# Patient Record
Sex: Female | Born: 1980 | Race: White | Hispanic: No | Marital: Married | Smoking: Never smoker
Health system: Southern US, Community
[De-identification: ages and names within clinical notes are randomized; demographics above are authoritative.]

## PROBLEM LIST (undated history)

## (undated) DIAGNOSIS — R519 Headache, unspecified: Secondary | ICD-10-CM

## (undated) DIAGNOSIS — G43909 Migraine, unspecified, not intractable, without status migrainosus: Secondary | ICD-10-CM

## (undated) DIAGNOSIS — H8109 Meniere's disease, unspecified ear: Secondary | ICD-10-CM

## (undated) DIAGNOSIS — E7800 Pure hypercholesterolemia, unspecified: Secondary | ICD-10-CM

## (undated) DIAGNOSIS — N6019 Diffuse cystic mastopathy of unspecified breast: Secondary | ICD-10-CM

## (undated) DIAGNOSIS — T7840XA Allergy, unspecified, initial encounter: Secondary | ICD-10-CM

## (undated) DIAGNOSIS — E039 Hypothyroidism, unspecified: Secondary | ICD-10-CM

## (undated) HISTORY — PX: MELANOMA EXCISION: SHX5266

## (undated) HISTORY — PX: TONSILLECTOMY: SUR1361

## (undated) HISTORY — DX: Hypothyroidism, unspecified: E03.9

## (undated) HISTORY — DX: Migraine, unspecified, not intractable, without status migrainosus: G43.909

## (undated) HISTORY — DX: Diffuse cystic mastopathy of unspecified breast: N60.19

## (undated) HISTORY — DX: Allergy, unspecified, initial encounter: T78.40XA

## (undated) HISTORY — DX: Meniere's disease, unspecified ear: H81.09

## (undated) HISTORY — DX: Pure hypercholesterolemia, unspecified: E78.00

## (undated) HISTORY — PX: WISDOM TOOTH EXTRACTION: SHX21

---

## 2019-06-18 LAB — OB RESULTS CONSOLE HGB/HCT, BLOOD
HCT: 41 (ref 29–41)
Hemoglobin: 13.9

## 2019-06-18 LAB — OB RESULTS CONSOLE PLATELET COUNT: Platelets: 332

## 2019-06-26 ENCOUNTER — Telehealth (INDEPENDENT_AMBULATORY_CARE_PROVIDER_SITE_OTHER): Payer: Self-pay | Admitting: Family Medicine

## 2019-06-26 DIAGNOSIS — O26891 Other specified pregnancy related conditions, first trimester: Secondary | ICD-10-CM

## 2019-06-26 DIAGNOSIS — R12 Heartburn: Secondary | ICD-10-CM

## 2019-06-26 NOTE — Telephone Encounter (Signed)
Pt called and reports she has heartburn and was taking Omeprazole. She is now taking Tums. She is getting it with all foods. She is eating small frequent meals. She is noticing that the heartburn is worse after PNV, Enc her to try in the evening.   Pt has appt in 1 month. Discussed we will not be able to prescribe for her until she is seen in the office. Pt voiced understanding.

## 2019-06-26 NOTE — Telephone Encounter (Signed)
Patient has not started care with [place] yet, but has a few questions and would like a call back.

## 2019-07-04 ENCOUNTER — Telehealth: Payer: Self-pay

## 2019-07-04 NOTE — Telephone Encounter (Signed)
Pt left VM stating she has some questions and would like a call back. Pt has new OB appt on 07/30/19 at which time she will be 11 weeks. Pt states this is a high risk pregnancy.

## 2019-07-05 NOTE — Telephone Encounter (Signed)
Called patient and she reports concern over new OB appt being on 2/23 at which time she will be 11 weeks. Patient states she knows she is high risk because she is 39 years old and is overweight. Patient states she had blood work done recently and her cholesterol and liver enzymes were elevated. She reports concern over new ob appt being at 11 weeks and felt she may need to be seen sooner. Reassured patient that most of our patients are seen around 11 weeks even our patients that are high risk. Discussed rarely are there health conditions in which we need to see someone sooner. Advised she mention her health concerns at her new OB appt. Patient verbalized understanding & had no questions.

## 2019-07-16 DIAGNOSIS — O0990 Supervision of high risk pregnancy, unspecified, unspecified trimester: Secondary | ICD-10-CM

## 2019-07-19 ENCOUNTER — Other Ambulatory Visit: Payer: Self-pay

## 2019-07-19 ENCOUNTER — Telehealth: Payer: Self-pay | Admitting: Family Medicine

## 2019-07-19 ENCOUNTER — Encounter (HOSPITAL_COMMUNITY): Payer: Self-pay | Admitting: Obstetrics & Gynecology

## 2019-07-19 ENCOUNTER — Inpatient Hospital Stay (HOSPITAL_COMMUNITY)
Admission: AD | Admit: 2019-07-19 | Discharge: 2019-07-19 | Disposition: A | Discharge status: Home / Self Care | Payer: No Typology Code available for payment source | Attending: Obstetrics & Gynecology | Admitting: Obstetrics & Gynecology

## 2019-07-19 ENCOUNTER — Inpatient Hospital Stay (HOSPITAL_COMMUNITY): Payer: No Typology Code available for payment source

## 2019-07-19 DIAGNOSIS — Z809 Family history of malignant neoplasm, unspecified: Secondary | ICD-10-CM | POA: Insufficient documentation

## 2019-07-19 DIAGNOSIS — O0991 Supervision of high risk pregnancy, unspecified, first trimester: Secondary | ICD-10-CM

## 2019-07-19 DIAGNOSIS — O3680X0 Pregnancy with inconclusive fetal viability, not applicable or unspecified: Secondary | ICD-10-CM | POA: Diagnosis present

## 2019-07-19 DIAGNOSIS — Z85820 Personal history of malignant melanoma of skin: Secondary | ICD-10-CM | POA: Insufficient documentation

## 2019-07-19 DIAGNOSIS — O99891 Other specified diseases and conditions complicating pregnancy: Secondary | ICD-10-CM | POA: Insufficient documentation

## 2019-07-19 DIAGNOSIS — O26851 Spotting complicating pregnancy, first trimester: Secondary | ICD-10-CM | POA: Diagnosis not present

## 2019-07-19 DIAGNOSIS — Z3A09 9 weeks gestation of pregnancy: Secondary | ICD-10-CM | POA: Diagnosis not present

## 2019-07-19 DIAGNOSIS — R1030 Lower abdominal pain, unspecified: Secondary | ICD-10-CM | POA: Diagnosis not present

## 2019-07-19 DIAGNOSIS — O0990 Supervision of high risk pregnancy, unspecified, unspecified trimester: Secondary | ICD-10-CM

## 2019-07-19 DIAGNOSIS — Z79899 Other long term (current) drug therapy: Secondary | ICD-10-CM | POA: Insufficient documentation

## 2019-07-19 DIAGNOSIS — Z880 Allergy status to penicillin: Secondary | ICD-10-CM | POA: Insufficient documentation

## 2019-07-19 HISTORY — DX: Headache, unspecified: R51.9

## 2019-07-19 HISTORY — DX: Meniere's disease, unspecified ear: H81.09

## 2019-07-19 LAB — HCG, QUANTITATIVE, PREGNANCY: hCG, Beta Chain, Quant, S: 16784 m[IU]/mL — ABNORMAL HIGH (ref <5)

## 2019-07-19 LAB — CBC
HCT: 41.6 % (ref 36.0–46.0)
Hemoglobin: 13.7 g/dL (ref 12.0–15.0)
MCH: 31.9 pg (ref 26.0–34.0)
MCHC: 32.9 g/dL (ref 30.0–36.0)
MCV: 97 fL (ref 80.0–100.0)
Platelets: 325 10*3/uL (ref 150–400)
RBC: 4.29 MIL/uL (ref 3.87–5.11)
RDW: 12 % (ref 11.5–15.5)
WBC: 6.9 10*3/uL (ref 4.0–10.5)
nRBC: 0 % (ref 0.0–0.2)

## 2019-07-19 LAB — WET PREP, GENITAL
Clue Cells Wet Prep HPF POC: NONE SEEN
Sperm: NONE SEEN
Trich, Wet Prep: NONE SEEN
Yeast Wet Prep HPF POC: NONE SEEN

## 2019-07-19 LAB — ABO/RH: ABO/RH(D): B POS

## 2019-07-19 LAB — HIV ANTIBODY (ROUTINE TESTING W REFLEX): HIV Screen 4th Generation wRfx: NONREACTIVE

## 2019-07-19 NOTE — Discharge Instructions (Signed)
Return to MAU:  If you have heavy bleeding that soaks through more that 2 pads per hour for an hour or more  If you bleed so much that you feel like you might pass out or you do pass out  If you have significant abdominal pain that is not improved with Tylenol 1000 mg  If you develop a fever > 100.5   

## 2019-07-19 NOTE — MAU Provider Note (Signed)
History     CSN: [id number]  Arrival date and time: 07/19/19 1159   First Provider Initiated Contact with Patient 07/19/19 1328      Chief Complaint  Patient presents with  . Abdominal Cramping  . Vaginal Bleeding   HPI  Ms.  Linda Kane is a 39 y.o. year old G82P0 female at [redacted]w[redacted]d weeks gestation who presents to MAU reporting vaginal spotting and lower abdominal cramping x 2 days. She had her pregnancy confirmed at her PCP in [place] and is scheduled to start [facility] with [facility] on 07/30/2019. She had an U/S on 07/13/2019 and the pregnancy measured 5-6 wks when she was supposed to be 9 wks. She rates her pain a 2/10 at this time. She denies any recent SI.   Past Medical History:  Diagnosis Date  . Headache   . Meniere's disease (cochlear hydrops)     Past Surgical History:  Procedure Laterality Date  . MELANOMA EXCISION    . TONSILLECTOMY    . WISDOM TOOTH EXTRACTION      Family History  Problem Relation Age of Onset  . Cancer Father   . Hypertension Father     Social History   Tobacco Use  . Smoking status: Never Smoker  . Smokeless tobacco: Never Used  Substance Use Topics  . Alcohol use: Not Currently  . Drug use: Never    Allergies:  Allergies  Allergen Reactions  . Penicillins     Medications Prior to Admission  Medication Sig Dispense Refill Last Dose  . diphenhydrAMINE (SOMINEX) 25 MG tablet Take 25 mg by mouth at bedtime as needed for itching or sleep.   07/18/2019 at Unknown time  . famotidine (PEPCID) 20 MG tablet Take 20 mg by mouth 2 (two) times daily.   07/19/2019 at Unknown time  . ibuprofen (ADVIL) 800 MG tablet Take 800 mg by mouth every 8 (eight) hours as needed.   07/18/2019 at Unknown time  . Prenatal Vit-Fe Fumarate-FA (PRENATAL MULTIVITAMIN) TABS tablet Take 1 tablet by mouth daily at 12 noon.   07/18/2019 at Unknown time  . RABEprazole (ACIPHEX) 20 MG tablet Take 40 mg by mouth daily. Take on an empty stomach, 30 minutes prior to a  meal.   07/19/2019 at Unknown time  . cetirizine (ZYRTEC) 10 MG tablet Take 10 mg by mouth daily.     . Melatonin 3 MG CAPS Take 9 mg by mouth every evening.     . Omega-3 Fatty Acids (FISH OIL PO) Take by mouth.     . propranolol (INDERAL) 80 MG tablet Take 80 mg by mouth 2 (two) times daily.     . rizatriptan (MAXALT) 10 MG tablet Take 10 mg by mouth as needed for migraine. May start with one-half tablet, take after or with Naproxen for migraine. Take one at onset, may repeat after 2 hours if no relief. Max of 30 mg per day.       Review of Systems  Constitutional: Negative.   HENT: Negative.   Eyes: Negative.   Respiratory: Negative.   Cardiovascular: Negative.   Gastrointestinal: Negative.   Endocrine: Negative.   Genitourinary: Positive for pelvic pain and vaginal bleeding (spotting).  Musculoskeletal: Negative.   Skin: Negative.   Allergic/Immunologic: Negative.   Neurological: Negative.   Hematological: Negative.   Psychiatric/Behavioral: Negative.    Physical Exam   Blood pressure (!) 138/55, pulse 94, temperature 97.8 F (36.6 C), temperature source Oral, resp. rate 17, height 5\' 7"  (1.702 m),  weight 127.1 kg, last menstrual period 05/11/2019, SpO2 100 %.  Physical Exam  Nursing note and vitals reviewed. Constitutional: She is oriented to person, place, and time. She appears well-developed and well-nourished.  HENT:  Head: Normocephalic and atraumatic.  Eyes: Pupils are equal, round, and reactive to light.  Cardiovascular: Normal rate.  Respiratory: Effort normal.  GI: Soft.  Genitourinary:    Genitourinary Comments: Uterus: non-tender, SE: cervix is smooth, pink, no lesions, small amt of thick, white vaginal d/c; no active bleeding noted or evidence of recent VB noted -- WP, GC/CT done, closed/long/firm, no CMT, (+) friability with speculum, mild adnexal tenderness bilaterally (L>R)   Musculoskeletal:        General: Normal range of motion.     Cervical back: Normal  range of motion.  Neurological: She is alert and oriented to person, place, and time.  Skin: Skin is warm and dry.  Psychiatric: She has a normal mood and affect. Her behavior is normal. Judgment and thought content normal.    MAU Course  Procedures  MDM CCUA UPT CBC ABO/Rh HCG Wet Prep GC/CT -- pending HIV -- pending OB < 14 wks [place] with TV  Results for orders placed or performed during the hospital encounter of 07/19/19 (from the past 24 hour(s))  Wet prep, genital     Status: Abnormal   Collection Time: 07/19/19  1:43 PM  Result Value Ref Range   Yeast Wet Prep HPF POC NONE SEEN NONE SEEN   Trich, Wet Prep NONE SEEN NONE SEEN   Clue Cells Wet Prep HPF POC NONE SEEN NONE SEEN   WBC, Wet Prep HPF POC MANY (A) NONE SEEN   Sperm NONE SEEN   CBC     Status: None   Collection Time: 07/19/19  1:58 PM  Result Value Ref Range   WBC 6.9 4.0 - 10.5 K/uL   RBC 4.29 3.87 - 5.11 MIL/uL   Hemoglobin 13.7 12.0 - 15.0 g/dL   HCT 13.0 86.5 - 78.4 %   MCV 97.0 80.0 - 100.0 fL   MCH 31.9 26.0 - 34.0 pg   MCHC 32.9 30.0 - 36.0 g/dL   RDW 69.6 29.5 - 28.4 %   Platelets 325 150 - 400 K/uL   nRBC 0.0 0.0 - 0.2 %  ABO/Rh     Status: None   Collection Time: 07/19/19  1:58 PM  Result Value Ref Range   ABO/RH(D) B POS    No rh immune globuloin      NOT A RH IMMUNE GLOBULIN CANDIDATE, PT RH POSITIVE Performed at [facility], [address], [place], [place] [address]   hCG, quantitative, pregnancy     Status: Abnormal   Collection Time: 07/19/19  1:58 PM  Result Value Ref Range   hCG, Beta Chain, Quant, S 16,784 (H) <5 mIU/mL    US OB LESS THAN 14 WEEKS WITH OB TRANSVAGINAL  Result Date: 07/19/2019 CLINICAL DATA:  Vaginal spotting EXAM: OBSTETRIC <14 WK [place] AND TRANSVAGINAL OB US TECHNIQUE: Both transabdominal and transvaginal ultrasound examinations were performed for complete evaluation of the gestation as well as the maternal uterus, adnexal regions, and pelvic cul-de-sac.  Transvaginal technique was performed to assess early pregnancy. COMPARISON:  None. FINDINGS: Intrauterine gestational sac: Small mildly irregular gestational sac is noted. Yolk sac:  Present Embryo:  Absent MSD: 12.1 mm   6 w   0 d Subchorionic hemorrhage:  None visualized. Maternal uterus/adnexae: Corpus luteum cyst is noted on the right.  Nabothian cysts are noted within the cervix. No free fluid is seen. IMPRESSION: Mild irregular gestational sac with small yolk sac although no definitive fetal pole, or cardiac activity yet visualized. Recommend follow-up quantitative B-HCG levels and follow-up US in 14 days to assess viability. This recommendation follows SRU consensus guidelines: Diagnostic Criteria for Nonviable Pregnancy Early in the First Trimester. Malva Limes Med 2013; 119:4174-08. Electronically Signed   By: Alcide Clever M.D.   On: 07/19/2019 14:59    Assessment and Plan  Pregnancy with uncertain fetal viability, single or unspecified fetus  - Viability U/S ordered for 2 wks - Information provided on threatened miscarriage   Spotting affecting pregnancy in first trimester - Information provided on VB in pregnancy, 1st trimester  - Return to MAU:  If you have heavier bleeding that soaks through more that 2 pads per hour for an hour or more  If you bleed so much that you feel like you might pass out or you do pass out  If you have significant abdominal pain that is not improved with Tylenol 1000 mg  If you develop a fever > 100.5  - Discharge home - Advised that someone from U/S will call to schedule appt - Keep scheduled appt with [facility] on 07/30/19 - Patient verbalized an understanding of the plan of care and agrees.    Raelyn Mora, MSN, CNM 07/19/2019, 1:28 PM

## 2019-07-19 NOTE — Telephone Encounter (Signed)
Patient called in stating that she has an appointment on 2/23 but she recently went to a private ultrasound place and they stated that her baby has not progressed past 4-5 weeks so it is likely that she has missed carried. Patient stated that she wants to schedule a d&c soon so prevent any infections. Spoke with Para March about this matter and she advised that the patient go to MAU to be evaluated further. Patient verbalized understanding.

## 2019-07-19 NOTE — MAU Note (Signed)
.  Linda Kane is a 39 y.o. at [redacted]w[redacted]d here in MAU reporting: Patient reports that she had a private [place] on 07/13/19 where the baby was still measuring 5-6 weeks and she was supposed to be 9 weeks at that time. Patient had pregnancy confirmation with the [place] in [place]. Patient reports she has been spotting and cramping x2 days.   Pain score: 2

## 2019-07-22 LAB — GC/CHLAMYDIA PROBE AMP (~~LOC~~) NOT AT ARMC
Chlamydia: NEGATIVE
Comment: NEGATIVE
Comment: NORMAL
Neisseria Gonorrhea: NEGATIVE

## 2019-07-29 ENCOUNTER — Inpatient Hospital Stay (HOSPITAL_COMMUNITY)
Admission: AD | Admit: 2019-07-29 | Discharge: 2019-07-29 | Disposition: A | Discharge status: Home / Self Care | Payer: No Typology Code available for payment source | Attending: Obstetrics and Gynecology | Admitting: Obstetrics and Gynecology

## 2019-07-29 ENCOUNTER — Inpatient Hospital Stay (HOSPITAL_COMMUNITY): Payer: No Typology Code available for payment source

## 2019-07-29 ENCOUNTER — Telehealth: Payer: Self-pay | Admitting: *Deleted

## 2019-07-29 ENCOUNTER — Encounter (HOSPITAL_COMMUNITY): Payer: Self-pay | Admitting: Obstetrics and Gynecology

## 2019-07-29 DIAGNOSIS — Z3A11 11 weeks gestation of pregnancy: Secondary | ICD-10-CM | POA: Insufficient documentation

## 2019-07-29 DIAGNOSIS — Z880 Allergy status to penicillin: Secondary | ICD-10-CM | POA: Insufficient documentation

## 2019-07-29 DIAGNOSIS — O209 Hemorrhage in early pregnancy, unspecified: Secondary | ICD-10-CM | POA: Insufficient documentation

## 2019-07-29 DIAGNOSIS — O3680X0 Pregnancy with inconclusive fetal viability, not applicable or unspecified: Secondary | ICD-10-CM | POA: Insufficient documentation

## 2019-07-29 DIAGNOSIS — O039 Complete or unspecified spontaneous abortion without complication: Secondary | ICD-10-CM

## 2019-07-29 DIAGNOSIS — R109 Unspecified abdominal pain: Secondary | ICD-10-CM | POA: Diagnosis not present

## 2019-07-29 DIAGNOSIS — O0990 Supervision of high risk pregnancy, unspecified, unspecified trimester: Secondary | ICD-10-CM

## 2019-07-29 DIAGNOSIS — O26891 Other specified pregnancy related conditions, first trimester: Secondary | ICD-10-CM | POA: Insufficient documentation

## 2019-07-29 LAB — CBC
HCT: 41.8 % (ref 36.0–46.0)
Hemoglobin: 13.9 g/dL (ref 12.0–15.0)
MCH: 31.5 pg (ref 26.0–34.0)
MCHC: 33.3 g/dL (ref 30.0–36.0)
MCV: 94.8 fL (ref 80.0–100.0)
Platelets: 337 10*3/uL (ref 150–400)
RBC: 4.41 MIL/uL (ref 3.87–5.11)
RDW: 12.1 % (ref 11.5–15.5)
WBC: 4.8 10*3/uL (ref 4.0–10.5)
nRBC: 0 % (ref 0.0–0.2)

## 2019-07-29 LAB — URINALYSIS, ROUTINE W REFLEX MICROSCOPIC
Bacteria, UA: NONE SEEN
Bilirubin Urine: NEGATIVE
Glucose, UA: NEGATIVE mg/dL
Ketones, ur: NEGATIVE mg/dL
Nitrite: NEGATIVE
Protein, ur: NEGATIVE mg/dL
Specific Gravity, Urine: 1.011 (ref 1.005–1.030)
pH: 6 (ref 5.0–8.0)

## 2019-07-29 LAB — HCG, QUANTITATIVE, PREGNANCY: hCG, Beta Chain, Quant, S: 1986 m[IU]/mL — ABNORMAL HIGH (ref <5)

## 2019-07-29 NOTE — MAU Provider Note (Signed)
History     CSN: [id number]  Arrival date and time: 07/29/19 1130   First Provider Initiated Contact with Patient 07/29/19 1325      Chief Complaint  Patient presents with  . Vaginal Bleeding  . Abdominal Pain   HPI Linda Kane is a 39 y.o. G2P1000 at [redacted]w[redacted]d who presents to MAU with chief complaint of recurrent heavy vaginal bleeding and abdominal cramping. These are new problems, onset about five days ago. Patient states her bleeding reached its heaviest point yesterday, but has reduced to spotting today. She endorses passing a large clot and what she believes was a gestational sac. She denies abdominal tenderness, dysuria, dizziness, weakness, syncope, fever or recent illness. She is remote from intercourse.  OB History    Gravida  2   Para  1   Term  1   Preterm      AB      Living        SAB      TAB      Ectopic      Multiple      Live Births              Past Medical History:  Diagnosis Date  . Headache   . Meniere's disease (cochlear hydrops)     Past Surgical History:  Procedure Laterality Date  . MELANOMA EXCISION    . TONSILLECTOMY    . WISDOM TOOTH EXTRACTION      Family History  Problem Relation Age of Onset  . Cancer Father   . Hypertension Father     Social History   Tobacco Use  . Smoking status: Never Smoker  . Smokeless tobacco: Never Used  Substance Use Topics  . Alcohol use: Not Currently  . Drug use: Never    Allergies:  Allergies  Allergen Reactions  . Penicillins     Medications Prior to Admission  Medication Sig Dispense Refill Last Dose  . Melatonin 3 MG CAPS Take 9 mg by mouth every evening.   07/28/2019 at Unknown time  . cetirizine (ZYRTEC) 10 MG tablet Take 10 mg by mouth daily.     . Omega-3 Fatty Acids (FISH OIL PO) Take by mouth.       Review of Systems  Constitutional: Negative for fever.  Respiratory: Negative for shortness of breath.   Gastrointestinal: Positive for abdominal pain.   Genitourinary: Positive for vaginal bleeding. Negative for dysuria.  Neurological: Negative for syncope and weakness.   Physical Exam   Blood pressure 139/76, pulse 87, temperature 97.7 F (36.5 C), resp. rate 18, last menstrual period 05/11/2019, SpO2 99 %.  Physical Exam  Nursing note and vitals reviewed. Constitutional: She is oriented to person, place, and time. She appears well-developed and well-nourished.  Cardiovascular: Normal rate.  Respiratory: Effort normal and breath sounds normal. No respiratory distress.  GI: Soft. She exhibits no distension. There is no abdominal tenderness. There is no rebound, no guarding and no CVA tenderness.  Genitourinary:    Uterus normal.     Vaginal discharge present.     Genitourinary Comments: Scant amount of dark red discharge visualized on sterile speculum exam. Small clot removed with fox swab. No CMT on bimanual.   Musculoskeletal:        General: Normal range of motion.  Neurological: She is alert and oriented to person, place, and time.  Skin: Skin is warm and dry.  Psychiatric: She has a normal mood and affect. Her behavior is normal.  Judgment and thought content normal.    MAU Course/MDM  Procedures  Patient Vitals for the past 24 hrs:  BP Temp Pulse Resp SpO2  07/29/19 1403 127/70 -- 80 -- --  07/29/19 1205 139/76 97.7 F (36.5 C) 87 18 99 %   Results for orders placed or performed during the hospital encounter of 07/29/19 (from the past 24 hour(s))  Urinalysis, Routine w reflex microscopic     Status: Abnormal   Collection Time: 07/29/19 12:03 PM  Result Value Ref Range   Color, Urine STRAW (A) YELLOW   APPearance CLEAR CLEAR   Specific Gravity, Urine 1.011 1.005 - 1.030   pH 6.0 5.0 - 8.0   Glucose, UA NEGATIVE NEGATIVE mg/dL   Hgb urine dipstick LARGE (A) NEGATIVE   Bilirubin Urine NEGATIVE NEGATIVE   Ketones, ur NEGATIVE NEGATIVE mg/dL   Protein, ur NEGATIVE NEGATIVE mg/dL   Nitrite NEGATIVE NEGATIVE    Leukocytes,Ua TRACE (A) NEGATIVE   RBC / HPF 21-50 0 - 5 RBC/hpf   WBC, UA 0-5 0 - 5 WBC/hpf   Bacteria, UA NONE SEEN NONE SEEN   Squamous Epithelial / LPF 0-5 0 - 5  CBC     Status: None   Collection Time: 07/29/19 12:15 PM  Result Value Ref Range   WBC 4.8 4.0 - 10.5 K/uL   RBC 4.41 3.87 - 5.11 MIL/uL   Hemoglobin 13.9 12.0 - 15.0 g/dL   HCT 40.0 86.7 - 61.9 %   MCV 94.8 80.0 - 100.0 fL   MCH 31.5 26.0 - 34.0 pg   MCHC 33.3 30.0 - 36.0 g/dL   RDW 50.9 32.6 - 71.2 %   Platelets 337 150 - 400 K/uL   nRBC 0.0 0.0 - 0.2 %  hCG, quantitative, pregnancy     Status: Abnormal   Collection Time: 07/29/19 12:15 PM  Result Value Ref Range   hCG, Beta Chain, Quant, S 1,986 (H) <5 mIU/mL   US OB Transvaginal  Result Date: 07/29/2019 CLINICAL DATA:  Abdominal bleeding and cramping with positive beta HCG EXAM: TRANSVAGINAL OB ULTRASOUND TECHNIQUE: Transvaginal ultrasound was performed for complete evaluation of the gestation as well as the maternal uterus, adnexal regions, and pelvic cul-de-sac. COMPARISON:  July 19, 2019 FINDINGS: Intrauterine gestational sac: Not convincingly visualized Yolk sac:  Not visualized Embryo:  Not visualized Cardiac Activity: Not visualized Subchorionic hemorrhage:  None visualized. Maternal uterus/adnexae: The endometrium is mildly thickened and inhomogeneous. There is a small sac like area containing apparent debris within the endometrium. Uterus otherwise appears unremarkable. There are several debris-filled nabothian cysts in the cervix, largest measuring 8 mm. No extrauterine pelvic or adnexal mass evident. Right ovary measures 2.9 x 1.9 x 1.6 cm. Left ovary measures 1.7 x 2.5 x 1.6 cm. There is an apparent corpus luteum on the right measuring 1.1 x 1.1 x 1.1 cm. No free pelvic fluid. IMPRESSION: An intrauterine gestation is not seen. The endometrium is thickened and mildly irregular. There is a small sac like area containing apparent debris. Suspect incomplete  spontaneous abortion possible retained products of conception. Patient should be followed sonographically serially until beta HCG returns to 0. These results will be called to the ordering clinician or representative by the Radiologist Assistant, and communication documented in the PACS or zVision Dashboard. Electronically Signed   By: Bretta Bang III M.D.   On: 07/29/2019 13:21   Assessment and Plan  --39 y.o. G2P1000 at [redacted]w[redacted]d  --Concern for missed ab --Quant hCG decreased from [phone]  to 1,986 --Blood type B POS --Discharge home in stable condition with bleeding precautions   F/U: --Non-stat Quant hCG [facility] in one week  Darlina Rumpf, CNM 07/29/2019, 2:21 PM

## 2019-07-29 NOTE — MAU Note (Signed)
.   Linda Kane is a 39 y.o. at [redacted]w[redacted]d here in MAU reporting: that she has been evaluated in MAU a week ago and was told she may be having a miscarriage. Pt states she started having heavier vaginal bleeding and abdominal cramping yesterday LMP: 05/11/19 Onset of complaint: ongoing Pain score: 5 Vitals:   07/29/19 1205  BP: 139/76  Pulse: 87  Resp: 18  Temp: 97.7 F (36.5 C)  SpO2: 99%     FHT: Lab orders placed from triage: UA

## 2019-07-29 NOTE — Telephone Encounter (Signed)
Linda Kane called front desk and call transferred to front desk. She reports she thinks she has had miscarriage because she had spotting then period like bleeding 2-3 days, then heavy bleeding x 2 days, then spotting today. States had a lot of cramping. Had been evaluated in MAU 2/12  for cramping and spotting. Has [place] scheduled for tomorrow. Instructed patient to go to MAU today for evaluation of possible miscarriage. Will cancel [place] for tomorrow. She voices understanding. Legrand Como

## 2019-07-29 NOTE — Discharge Instructions (Signed)

## 2019-07-30 ENCOUNTER — Encounter: Payer: Self-pay | Admitting: Student

## 2019-07-30 ENCOUNTER — Ambulatory Visit (HOSPITAL_COMMUNITY): Payer: No Typology Code available for payment source

## 2019-08-01 ENCOUNTER — Other Ambulatory Visit: Payer: Self-pay | Admitting: *Deleted

## 2019-08-01 DIAGNOSIS — O039 Complete or unspecified spontaneous abortion without complication: Secondary | ICD-10-CM

## 2019-08-05 ENCOUNTER — Other Ambulatory Visit: Payer: No Typology Code available for payment source

## 2019-08-05 ENCOUNTER — Other Ambulatory Visit: Payer: Self-pay

## 2019-08-05 DIAGNOSIS — O039 Complete or unspecified spontaneous abortion without complication: Secondary | ICD-10-CM

## 2019-08-06 ENCOUNTER — Telehealth: Payer: Self-pay | Admitting: Advanced Practice Midwife

## 2019-08-06 LAB — BETA HCG QUANT (REF LAB): hCG Quant: 67 m[IU]/mL

## 2019-08-06 NOTE — Telephone Encounter (Signed)
VLTC[facility]   Clayton Bibles, MSN, CNM Certified Nurse Midwife, [employer] for [employer], [facility] 08/06/19 8:12 AM

## 2019-08-06 NOTE — Telephone Encounter (Signed)
Patient returned my call. Reviewed Quant hCG results of 84, consistent with spontaneous miscarriage. Encouraged patient to make appointment for repeat non-stat Quant hCG and Provider appointment in one week per practice policy. Patient verbalized understanding and stated she intended to call to schedule follow up.  Clayton Bibles, MSN, CNM Certified Nurse Midwife, [facility] for [employer], [facility] 08/06/19 8:18 AM

## 2020-10-26 IMAGING — US US OB < 14 WEEKS - US OB TV
1 series · 15 of 28 positions shown · non-contrast
Comparison: None.

CLINICAL DATA: Vaginal spotting

EXAM:
OBSTETRIC <14 WK US AND TRANSVAGINAL OB US
TECHNIQUE: Both transabdominal and transvaginal ultrasound examinations were
performed for complete evaluation of the gestation as well as the
maternal uterus, adnexal regions, and pelvic cul-de-sac.
Transvaginal technique was performed to assess early pregnancy.

[Series 1: us ob < 14 weeks - us ob tv · 15 of 46 slices shown]
[im 1/46]
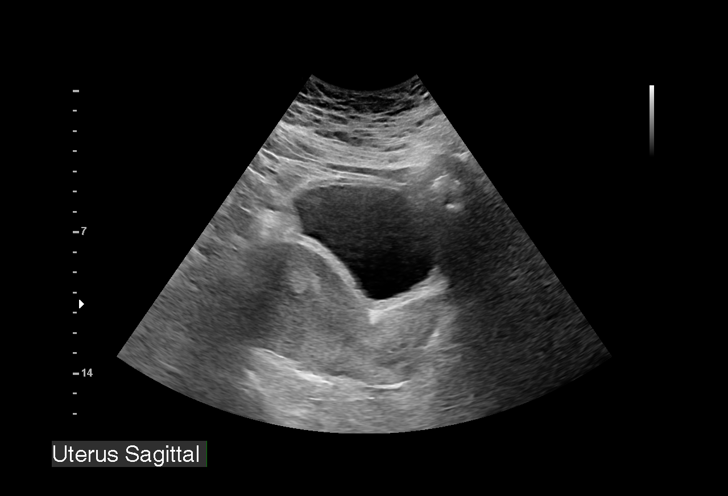
[im 4/46]
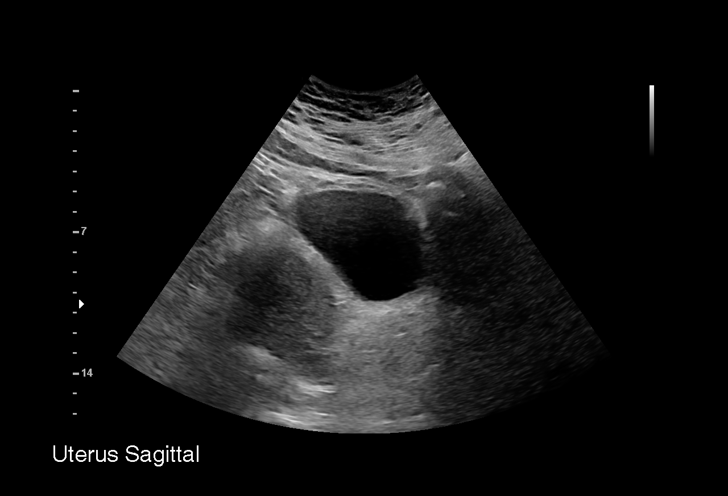
[im 7/46]
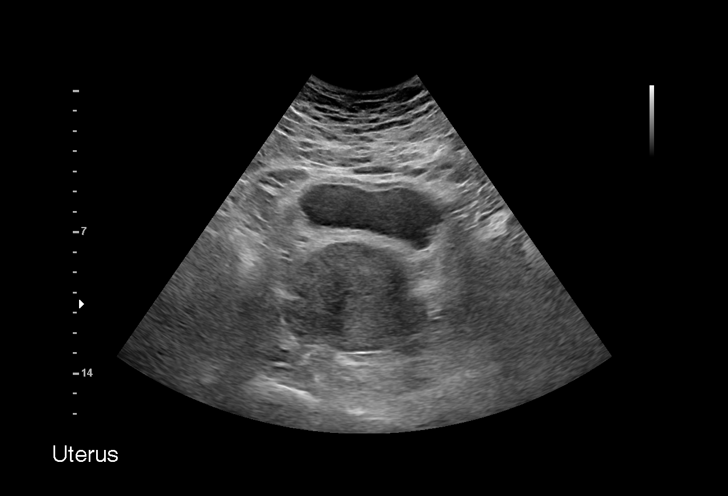
[im 11/46]
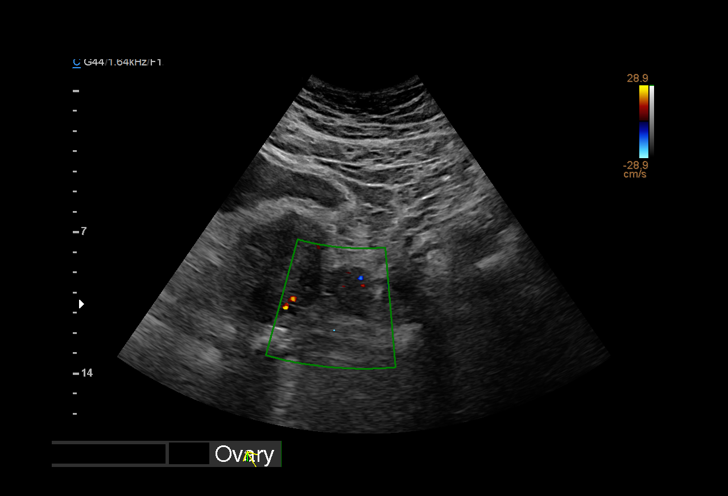
[im 14/46]
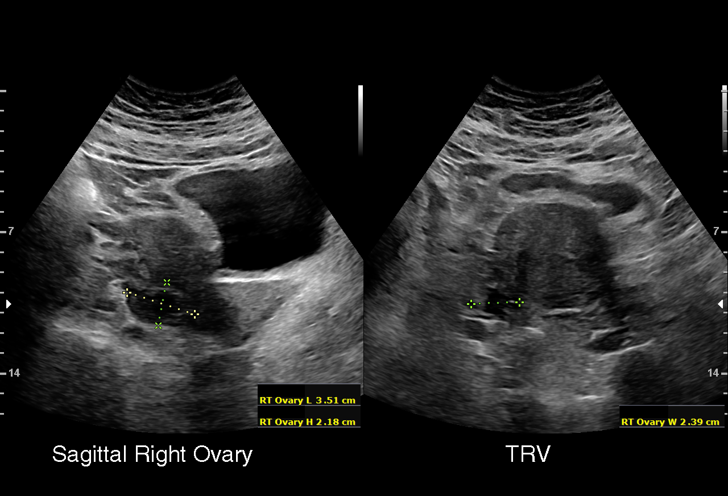
[im 17/46]
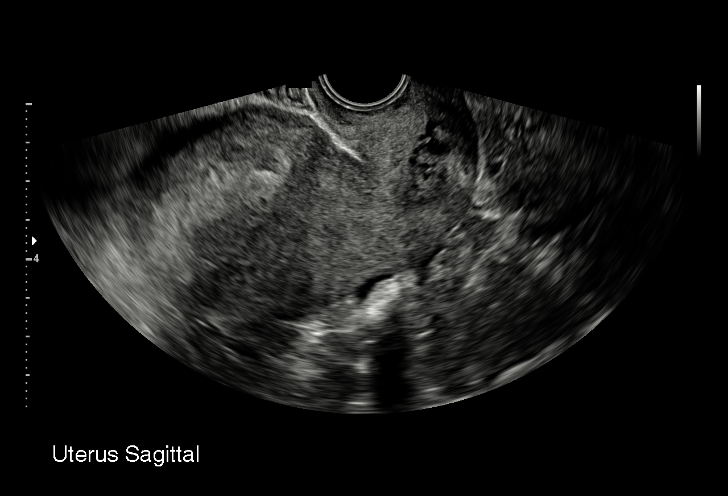
[im 21/46]
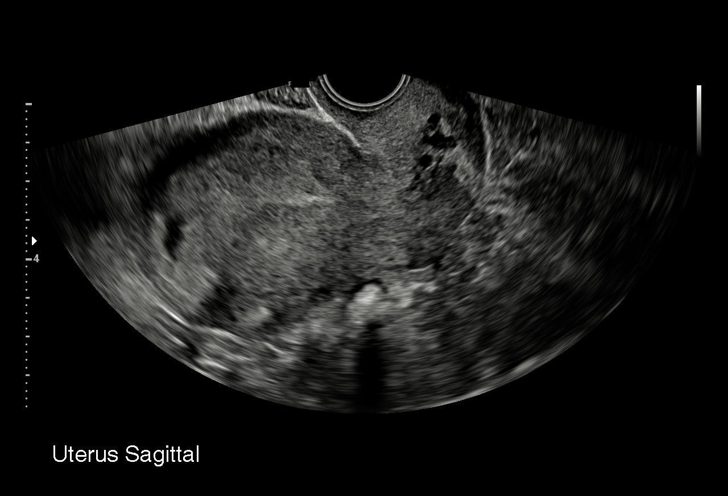
[im 24/46]
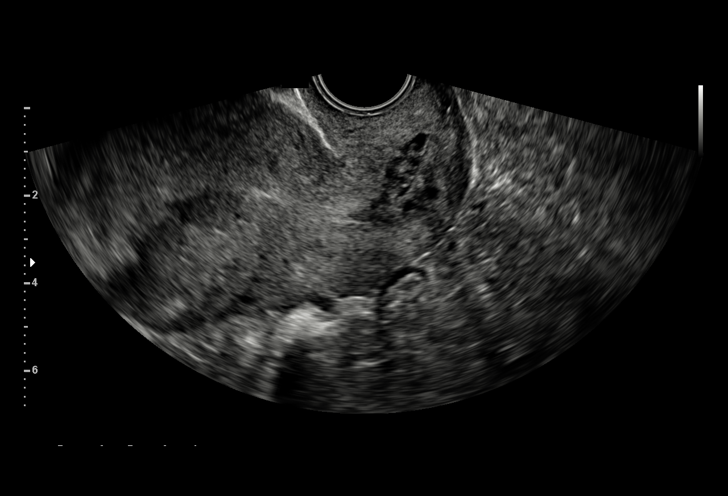
[im 26/46]
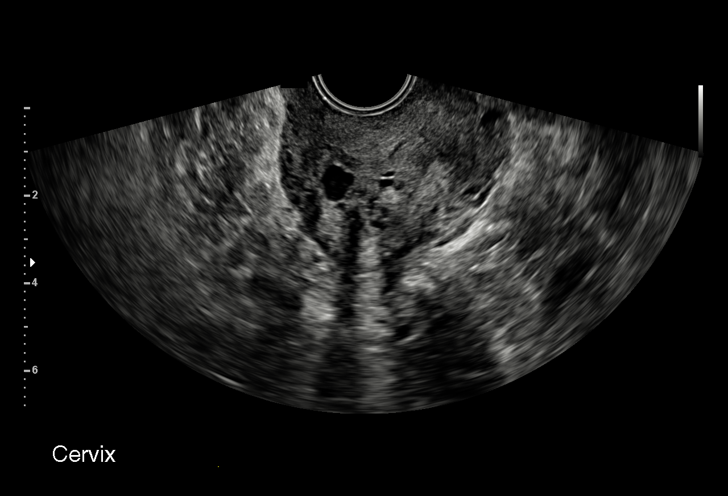
[im 29/46]
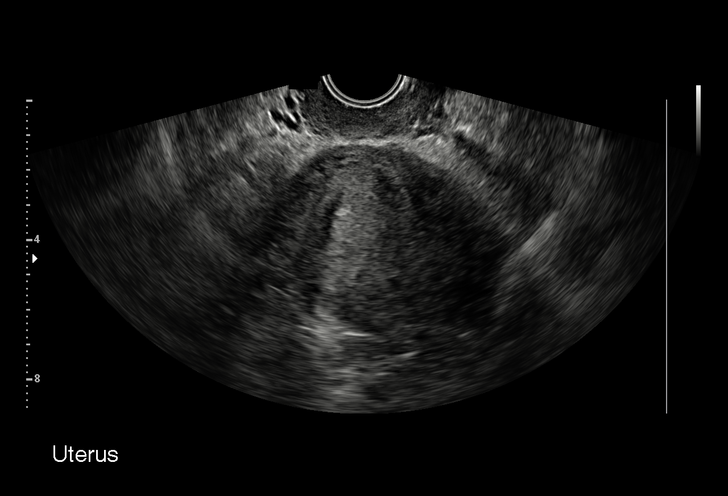
[im 32/46]
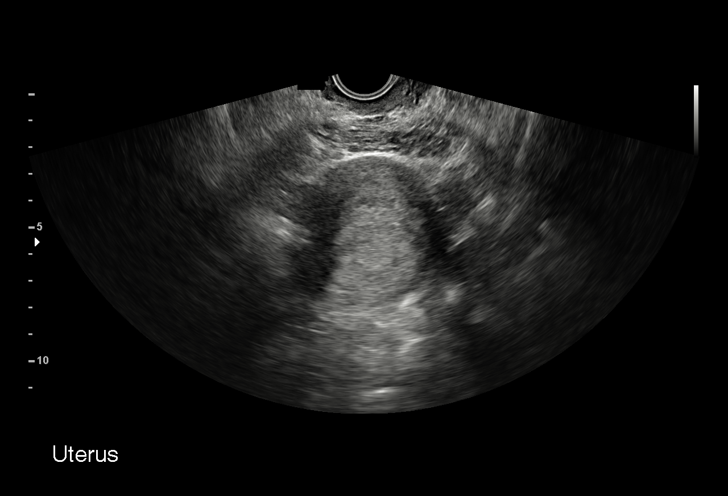
[im 36/46]
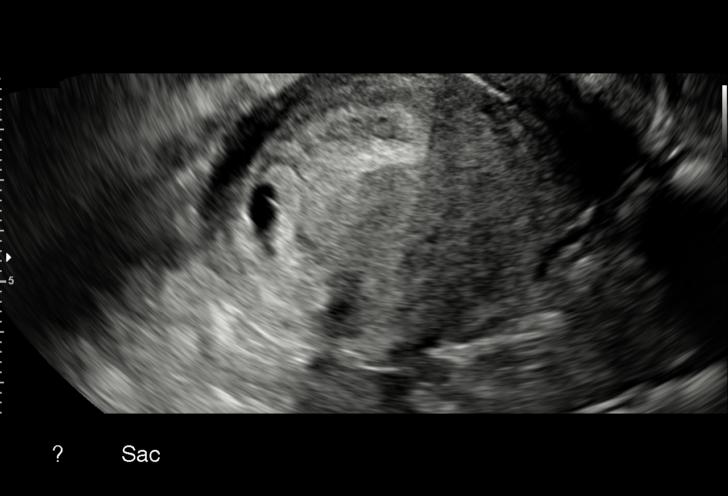
[im 39/46]
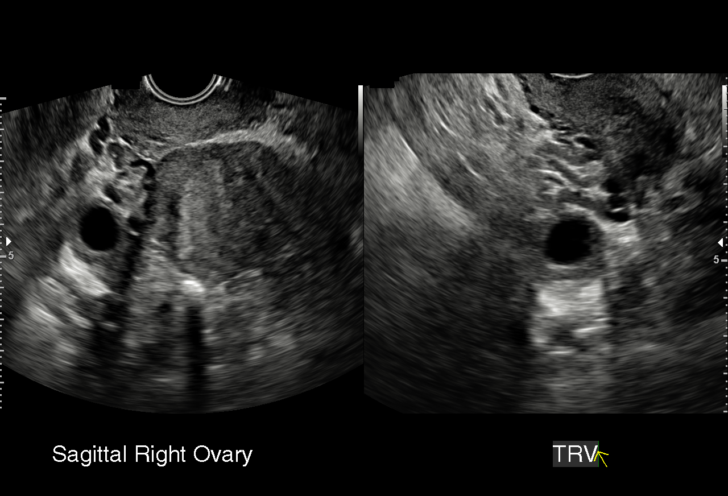
[im 42/46]
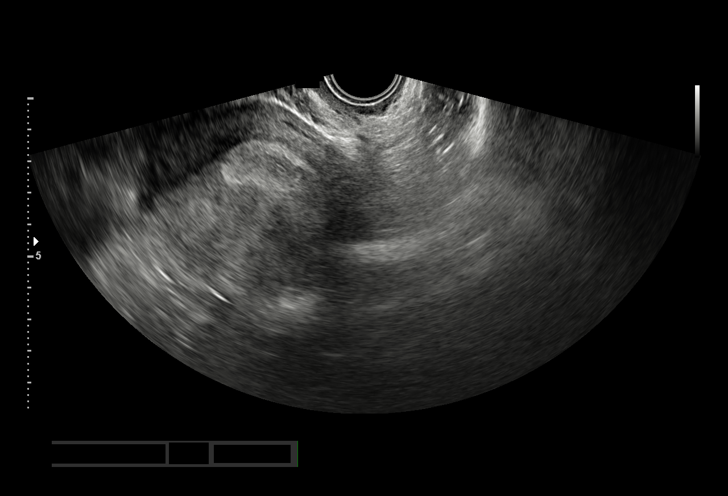
[im 46/46]
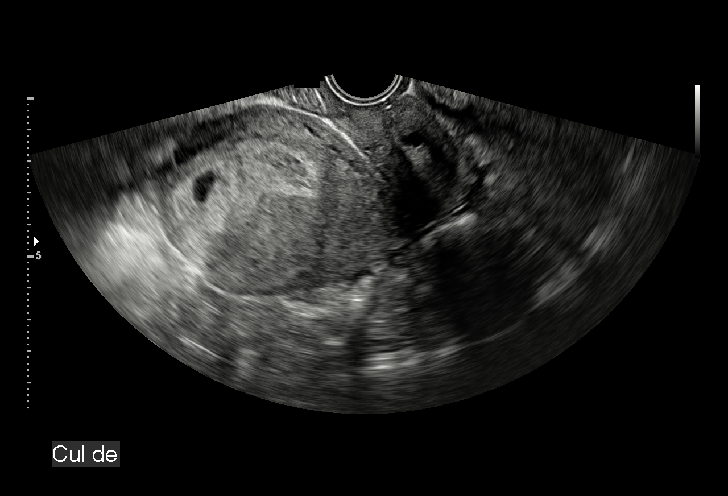

[15 of 28 positions shown; findings below may reference images not displayed]

FINDINGS: Intrauterine gestational sac: Small mildly irregular gestational sac
is noted.

Yolk sac:  Present

Embryo:  Absent

MSD: 12.1 mm   6 w   0 d

Subchorionic hemorrhage:  None visualized.

Maternal uterus/adnexae: Corpus luteum cyst is noted on the right.
Nabothian cysts are noted within the cervix. No free fluid is seen.
IMPRESSION: Mild irregular gestational sac with small yolk sac although no
definitive fetal pole, or cardiac activity yet visualized. Recommend
follow-up quantitative B-HCG levels and follow-up US in 14 days to
assess viability. This recommendation follows SRU consensus
guidelines: Diagnostic Criteria for Nonviable Pregnancy Early in the
First Trimester. N Engl J Med 8076; [DATE].

## 2024-03-28 ENCOUNTER — Encounter: Payer: Self-pay | Admitting: Obstetrics and Gynecology

## 2024-03-28 ENCOUNTER — Ambulatory Visit (INDEPENDENT_AMBULATORY_CARE_PROVIDER_SITE_OTHER): Admitting: Obstetrics and Gynecology

## 2024-03-28 VITALS — BP 110/60 | HR 86 | Ht 66.0 in | Wt 225.8 lb

## 2024-03-28 DIAGNOSIS — N3941 Urge incontinence: Secondary | ICD-10-CM

## 2024-03-28 DIAGNOSIS — N906 Unspecified hypertrophy of vulva: Secondary | ICD-10-CM

## 2024-03-28 NOTE — Progress Notes (Signed)
 43 y.o. y.o. female here for surgical consult from [place] for prominent left labial minora and urinary incontinence Patient's last menstrual period was 02/27/2024 (within days). Period Cycle (Days): 28 Period Duration (Days): 2 Period Pattern: Regular Menstrual Flow: Light Menstrual Control: Panty liner Dysmenorrhea: (!) Mild Dysmenorrhea Symptoms: Cramping  Bothered for years with left labia that she has to tuck in or gets caught and tears. She is not considered with cosmetics, but has multiple lesions from it catching on clothes etc. Also hurts with intercourse. Swim suits are difficult to wear and she is constantly thinking of the area.  Patient also with urgency: notices when she is walking she get a sudden urge and voids thereafter. Has had several accidents in public. She had her annual gyn exam at the [place] [facility] 02/23/24 and normal denies abnormal pap smears Mirena IUD placed 2021 and pleased with it. Not planning on having more children Has lost 50lbs with zepbound Body mass index is 36.45 kg/m.  Blood pressure 110/60, pulse 86, height 5' 6 (1.676 m), weight 225 lb 12.8 oz (102.4 kg), last menstrual period 02/27/2024, SpO2 95%, not currently breastfeeding.  No results found for: DIAGPAP, HPVHIGH, ADEQPAP  GYN HISTORY: No results found for: DIAGPAP, HPVHIGH, ADEQPAP  OB History  Gravida Para Term Preterm AB Living  3 1 1  1 1   SAB IAB Ectopic Multiple Live Births          # Outcome Date GA Lbr Len/2nd Weight Sex Type Anes PTL Lv  3 Term 07/05/08 [redacted]w[redacted]d    Vag-Spont     2 AB           1 Gravida             Past Medical History:  Diagnosis Date   Active Mnire's disease, vestibular    Allergy    Fibrocystic breast    Headache    High cholesterol    Hypothyroid    Meniere's disease (cochlear hydrops)    Migraines     Past Surgical History:  Procedure Laterality Date   MELANOMA EXCISION     TONSILLECTOMY     WISDOM TOOTH EXTRACTION      Current  Outpatient Medications on File Prior to Visit  Medication Sig Dispense Refill   cetirizine (ZYRTEC) 10 MG tablet Take 10 mg by mouth daily.     esomeprazole (NEXIUM) 40 MG capsule Take 40 mg by mouth.     ibuprofen (ADVIL) 200 MG tablet Take 600 mg by mouth as needed.     levothyroxine (SYNTHROID) 25 MCG tablet Take 25 mcg by mouth.     Melatonin 3 MG CAPS Take 9 mg by mouth every evening.     Omega-3 Fatty Acids (FISH OIL PO) Take by mouth.     ondansetron (ZOFRAN-ODT) 4 MG disintegrating tablet Take 4 mg by mouth as needed.     sertraline (ZOLOFT) 100 MG tablet Take 100 mg by mouth.     tirzepatide (ZEPBOUND) 10 MG/0.5ML Pen Inject 10 mg into the skin.     No current facility-administered medications on file prior to visit.    Social History   Socioeconomic History   Marital status: Married    Spouse name: Not on file   Number of children: Not on file   Years of education: Not on file   Highest education level: Not on file  Occupational History   Not on file  Tobacco Use   Smoking status: Never   Smokeless tobacco:  Never  Vaping Use   Vaping status: Never Used  Substance and Sexual Activity   Alcohol use: Yes    Comment: soical   Drug use: Never   Sexual activity: Yes    Partners: Male    Birth control/protection: I.U.D.    Comment: men, 43 yrs old, 1st intercourse 73ys old  Other Topics Concern   Not on file  Social History Narrative   Not on file   Social Drivers of Health   Financial Resource Strain: Not on file  Food Insecurity: No Food Insecurity (01/19/2023)   Received [facility]   Hunger Vital Sign    Within the past 12 months, you worried that your food would run out before you got the money to buy more.: Never true    Within the past 12 months, the food you bought just didn't last and you didn't have money to get more.: Never true  Transportation Needs: Unmet Transportation Needs (01/19/2023)   Received from [facility] -  Transportation    Lack of Transportation (Medical): Yes    Lack of Transportation (Non-Medical): Yes  Physical Activity: Not on file  Stress: No Stress Concern Present (01/19/2023)   Received from [facility] - Occupational Stress Questionnaire    Feeling of Stress : Not at all  Social Connections: Not on file  Intimate Partner Violence: Not At Risk (10/06/2023)   Received from [facility]   HITS    Over the last 12 months how often did your partner physically hurt you?: Never    Over the last 12 months how often did your partner insult you or talk down to you?: Never    Over the last 12 months how often did your partner threaten you with physical harm?: Never    Over the last 12 months how often did your partner scream or curse at you?: Never    Family History  Problem Relation Age of Onset   Hypertension Father    Cancer Father    Diabetes Paternal Grandfather      Allergies  Allergen Reactions   Penicillins       Patient's last menstrual period was Patient's last menstrual period was 02/27/2024 (within days)..            Review of Systems Alls systems reviewed and are negative.     Physical Exam Constitutional:      Appearance: Normal appearance.  Genitourinary:     Musculoskeletal:     Cervical back: Normal range of motion.  Neurological:     Mental Status: She is alert and oriented to person, place, and time.  Psychiatric:        Mood and Affect: Mood normal.        Behavior: Behavior normal.  Vitals and nursing note reviewed.       A:        Enlarged left labia minora Urinary incontinence                             P:        Discussed risks with labial plasty including but not limited to the risk of bleeding, swelling, infection, wound breakdown, poor cosmesis, pain, asymmetric healing, scar tissue with retraction and counseled on post operative care. Discussed referral to urogyn for evaluation of  urinary incontinence and discussed possible combined case, if indicated. Surgery referral was placed.  No follow-ups on file.  Linda Kane
